# Patient Record
Sex: Male | Born: 2000
Health system: Southern US, Community
[De-identification: ages and names within clinical notes are randomized; demographics above are authoritative.]

## PROBLEM LIST (undated history)

## (undated) DIAGNOSIS — R569 Unspecified convulsions: Secondary | ICD-10-CM

## (undated) DIAGNOSIS — S92901A Unspecified fracture of right foot, initial encounter for closed fracture: Secondary | ICD-10-CM

---
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
## 2019-07-18 DIAGNOSIS — Z20822 Contact with and (suspected) exposure to covid-19: Secondary | ICD-10-CM | POA: Diagnosis not present

## 2019-08-10 DIAGNOSIS — Z20828 Contact with and (suspected) exposure to other viral communicable diseases: Secondary | ICD-10-CM | POA: Diagnosis not present

## 2019-08-10 DIAGNOSIS — Z03818 Encounter for observation for suspected exposure to other biological agents ruled out: Secondary | ICD-10-CM | POA: Diagnosis not present

## 2021-05-08 DIAGNOSIS — S0101XA Laceration without foreign body of scalp, initial encounter: Secondary | ICD-10-CM | POA: Diagnosis not present

## 2021-05-17 DIAGNOSIS — Z4802 Encounter for removal of sutures: Secondary | ICD-10-CM | POA: Diagnosis not present

## 2021-06-26 DIAGNOSIS — D225 Melanocytic nevi of trunk: Secondary | ICD-10-CM | POA: Diagnosis not present

## 2021-06-26 DIAGNOSIS — L821 Other seborrheic keratosis: Secondary | ICD-10-CM | POA: Diagnosis not present

## 2021-06-26 DIAGNOSIS — L814 Other melanin hyperpigmentation: Secondary | ICD-10-CM | POA: Diagnosis not present

## 2021-06-26 DIAGNOSIS — L708 Other acne: Secondary | ICD-10-CM | POA: Diagnosis not present

## 2022-06-18 DIAGNOSIS — Z13220 Encounter for screening for lipoid disorders: Secondary | ICD-10-CM | POA: Diagnosis not present

## 2022-06-18 DIAGNOSIS — Z0000 Encounter for general adult medical examination without abnormal findings: Secondary | ICD-10-CM | POA: Diagnosis not present

## 2022-06-18 DIAGNOSIS — Z23 Encounter for immunization: Secondary | ICD-10-CM | POA: Diagnosis not present

## 2022-07-17 DIAGNOSIS — J40 Bronchitis, not specified as acute or chronic: Secondary | ICD-10-CM | POA: Diagnosis not present

## 2022-07-17 DIAGNOSIS — R052 Subacute cough: Secondary | ICD-10-CM | POA: Diagnosis not present

## 2022-07-24 ENCOUNTER — Ambulatory Visit
Admission: RE | Admit: 2022-07-24 | Discharge: 2022-07-24 | Disposition: A | Discharge status: Home / Self Care | Payer: Self-pay | Source: Ambulatory Visit | Attending: Family Medicine | Admitting: Family Medicine

## 2022-07-24 ENCOUNTER — Other Ambulatory Visit: Payer: Self-pay | Admitting: Family Medicine

## 2022-07-24 DIAGNOSIS — R0602 Shortness of breath: Secondary | ICD-10-CM | POA: Diagnosis not present

## 2022-07-24 DIAGNOSIS — R0989 Other specified symptoms and signs involving the circulatory and respiratory systems: Secondary | ICD-10-CM

## 2023-01-09 DIAGNOSIS — M79673 Pain in unspecified foot: Secondary | ICD-10-CM | POA: Diagnosis not present

## 2023-01-09 DIAGNOSIS — L84 Corns and callosities: Secondary | ICD-10-CM | POA: Diagnosis not present

## 2023-07-05 DIAGNOSIS — Z0000 Encounter for general adult medical examination without abnormal findings: Secondary | ICD-10-CM | POA: Diagnosis not present

## 2023-11-22 DIAGNOSIS — N50819 Testicular pain, unspecified: Secondary | ICD-10-CM | POA: Diagnosis not present

## 2023-12-01 ENCOUNTER — Other Ambulatory Visit: Payer: Self-pay | Admitting: Family Medicine

## 2023-12-01 DIAGNOSIS — N50819 Testicular pain, unspecified: Secondary | ICD-10-CM

## 2023-12-03 ENCOUNTER — Other Ambulatory Visit: Payer: Self-pay

## 2023-12-14 ENCOUNTER — Other Ambulatory Visit: Payer: Self-pay

## 2023-12-21 ENCOUNTER — Ambulatory Visit
Admission: RE | Admit: 2023-12-21 | Discharge: 2023-12-21 | Disposition: A | Discharge status: Home / Self Care | Payer: Self-pay | Source: Ambulatory Visit | Attending: Family Medicine | Admitting: Family Medicine

## 2023-12-21 DIAGNOSIS — N50819 Testicular pain, unspecified: Secondary | ICD-10-CM
